# Patient Record
Sex: Male | Born: 1940 | Race: White | Hispanic: No | Marital: Married | Smoking: Former smoker
Health system: Southern US, Community
[De-identification: ages and names within clinical notes are randomized; demographics above are authoritative.]

## PROBLEM LIST (undated history)

## (undated) DIAGNOSIS — J449 Chronic obstructive pulmonary disease, unspecified: Secondary | ICD-10-CM

## (undated) DIAGNOSIS — N390 Urinary tract infection, site not specified: Secondary | ICD-10-CM

## (undated) DIAGNOSIS — F32A Depression, unspecified: Secondary | ICD-10-CM

## (undated) DIAGNOSIS — M47817 Spondylosis without myelopathy or radiculopathy, lumbosacral region: Secondary | ICD-10-CM

## (undated) DIAGNOSIS — N400 Enlarged prostate without lower urinary tract symptoms: Secondary | ICD-10-CM

## (undated) DIAGNOSIS — R319 Hematuria, unspecified: Secondary | ICD-10-CM

## (undated) DIAGNOSIS — M719 Bursopathy, unspecified: Secondary | ICD-10-CM

## (undated) DIAGNOSIS — M6580 Other synovitis and tenosynovitis, unspecified site: Secondary | ICD-10-CM

## (undated) DIAGNOSIS — G4733 Obstructive sleep apnea (adult) (pediatric): Secondary | ICD-10-CM

## (undated) DIAGNOSIS — M25519 Pain in unspecified shoulder: Secondary | ICD-10-CM

## (undated) DIAGNOSIS — F329 Major depressive disorder, single episode, unspecified: Secondary | ICD-10-CM

## (undated) DIAGNOSIS — N200 Calculus of kidney: Secondary | ICD-10-CM

## (undated) DIAGNOSIS — F319 Bipolar disorder, unspecified: Secondary | ICD-10-CM

## (undated) DIAGNOSIS — E11329 Type 2 diabetes mellitus with mild nonproliferative diabetic retinopathy without macular edema: Secondary | ICD-10-CM

## (undated) DIAGNOSIS — M19019 Primary osteoarthritis, unspecified shoulder: Secondary | ICD-10-CM

## (undated) DIAGNOSIS — D126 Benign neoplasm of colon, unspecified: Secondary | ICD-10-CM

## (undated) DIAGNOSIS — R0602 Shortness of breath: Secondary | ICD-10-CM

## (undated) DIAGNOSIS — M7989 Other specified soft tissue disorders: Secondary | ICD-10-CM

## (undated) DIAGNOSIS — K219 Gastro-esophageal reflux disease without esophagitis: Secondary | ICD-10-CM

## (undated) DIAGNOSIS — M67919 Unspecified disorder of synovium and tendon, unspecified shoulder: Secondary | ICD-10-CM

## (undated) DIAGNOSIS — R222 Localized swelling, mass and lump, trunk: Secondary | ICD-10-CM

## (undated) DIAGNOSIS — N35919 Unspecified urethral stricture, male, unspecified site: Secondary | ICD-10-CM

## (undated) DIAGNOSIS — N3941 Urge incontinence: Secondary | ICD-10-CM

## (undated) DIAGNOSIS — R42 Dizziness and giddiness: Secondary | ICD-10-CM

## (undated) DIAGNOSIS — M25659 Stiffness of unspecified hip, not elsewhere classified: Secondary | ICD-10-CM

## (undated) DIAGNOSIS — E119 Type 2 diabetes mellitus without complications: Secondary | ICD-10-CM

## (undated) DIAGNOSIS — K7689 Other specified diseases of liver: Secondary | ICD-10-CM

## (undated) DIAGNOSIS — Z9109 Other allergy status, other than to drugs and biological substances: Secondary | ICD-10-CM

## (undated) DIAGNOSIS — Z79899 Other long term (current) drug therapy: Secondary | ICD-10-CM

## (undated) DIAGNOSIS — R351 Nocturia: Secondary | ICD-10-CM

## (undated) DIAGNOSIS — R350 Frequency of micturition: Secondary | ICD-10-CM

## (undated) DIAGNOSIS — E785 Hyperlipidemia, unspecified: Secondary | ICD-10-CM

## (undated) DIAGNOSIS — F528 Other sexual dysfunction not due to a substance or known physiological condition: Secondary | ICD-10-CM

## (undated) DIAGNOSIS — M1710 Unilateral primary osteoarthritis, unspecified knee: Secondary | ICD-10-CM

## (undated) DIAGNOSIS — M25579 Pain in unspecified ankle and joints of unspecified foot: Secondary | ICD-10-CM

## (undated) DIAGNOSIS — E669 Obesity, unspecified: Secondary | ICD-10-CM

## (undated) DIAGNOSIS — IMO0002 Reserved for concepts with insufficient information to code with codable children: Secondary | ICD-10-CM

## (undated) HISTORY — DX: Other synovitis and tenosynovitis, unspecified site: M65.80

## (undated) HISTORY — DX: Calculus of kidney: N20.0

## (undated) HISTORY — DX: Unilateral primary osteoarthritis, unspecified knee: M17.10

## (undated) HISTORY — DX: Hematuria, unspecified: R31.9

## (undated) HISTORY — DX: Other specified soft tissue disorders: M79.89

## (undated) HISTORY — DX: Urinary tract infection, site not specified: N39.0

## (undated) HISTORY — DX: Dizziness and giddiness: R42

## (undated) HISTORY — DX: Stiffness of unspecified hip, not elsewhere classified: M25.659

## (undated) HISTORY — PX: APPENDECTOMY: SHX54

## (undated) HISTORY — DX: Pain in unspecified shoulder: M25.519

## (undated) HISTORY — DX: Bursopathy, unspecified: M71.9

## (undated) HISTORY — PX: PROSTATE SURGERY: SHX751

## (undated) HISTORY — PX: SHOULDER SURGERY: SHX246

## (undated) HISTORY — PX: KNEE SURGERY: SHX244

## (undated) HISTORY — DX: Gastro-esophageal reflux disease without esophagitis: K21.9

## (undated) HISTORY — DX: Obesity, unspecified: E66.9

## (undated) HISTORY — DX: Nocturia: R35.1

## (undated) HISTORY — DX: Reserved for concepts with insufficient information to code with codable children: IMO0002

## (undated) HISTORY — DX: Urge incontinence: N39.41

## (undated) HISTORY — DX: Shortness of breath: R06.02

## (undated) HISTORY — DX: Frequency of micturition: R35.0

## (undated) HISTORY — DX: Chronic obstructive pulmonary disease, unspecified: J44.9

## (undated) HISTORY — DX: Other allergy status, other than to drugs and biological substances: Z91.09

## (undated) HISTORY — DX: Other sexual dysfunction not due to a substance or known physiological condition: F52.8

## (undated) HISTORY — PX: TONSILLECTOMY: SUR1361

## (undated) HISTORY — DX: Unspecified urethral stricture, male, unspecified site: N35.919

## (undated) HISTORY — PX: INGUINAL HERNIA REPAIR: SUR1180

## (undated) HISTORY — DX: Type 2 diabetes mellitus with mild nonproliferative diabetic retinopathy without macular edema: E11.329

## (undated) HISTORY — DX: Pain in unspecified ankle and joints of unspecified foot: M25.579

## (undated) HISTORY — DX: Primary osteoarthritis, unspecified shoulder: M19.019

## (undated) HISTORY — DX: Obstructive sleep apnea (adult) (pediatric): G47.33

## (undated) HISTORY — DX: Spondylosis without myelopathy or radiculopathy, lumbosacral region: M47.817

## (undated) HISTORY — DX: Unspecified disorder of synovium and tendon, unspecified shoulder: M67.919

## (undated) HISTORY — DX: Benign neoplasm of colon, unspecified: D12.6

## (undated) HISTORY — DX: Localized swelling, mass and lump, trunk: R22.2

## (undated) HISTORY — DX: Benign prostatic hyperplasia without lower urinary tract symptoms: N40.0

## (undated) HISTORY — DX: Type 2 diabetes mellitus without complications: E11.9

## (undated) HISTORY — DX: Other specified diseases of liver: K76.89

## (undated) HISTORY — DX: Other long term (current) drug therapy: Z79.899

## (undated) HISTORY — DX: Hyperlipidemia, unspecified: E78.5

## (undated) HISTORY — PX: UMBILICAL HERNIA REPAIR: SHX196

---

## 2005-11-30 HISTORY — PX: CHOLECYSTECTOMY: SHX55

## 2006-08-31 HISTORY — PX: TRANSURETHRAL RESECTION OF PROSTATE: SHX73

## 2007-11-27 HISTORY — PX: ESOPHAGOGASTRODUODENOSCOPY: SHX1529

## 2009-10-21 ENCOUNTER — Ambulatory Visit: Payer: Self-pay | Admitting: Diagnostic Radiology

## 2009-10-21 ENCOUNTER — Emergency Department (HOSPITAL_BASED_OUTPATIENT_CLINIC_OR_DEPARTMENT_OTHER): Admission: EM | Admit: 2009-10-21 | Discharge: 2009-10-21 | Payer: Self-pay | Admitting: Emergency Medicine

## 2010-05-15 LAB — CBC
HCT: 40.7 % (ref 39.0–52.0)
RBC: 4.23 MIL/uL (ref 4.22–5.81)
RDW: 13.8 % (ref 11.5–15.5)
WBC: 9.8 10*3/uL (ref 4.0–10.5)

## 2010-05-15 LAB — DIFFERENTIAL
Monocytes Absolute: 0.7 10*3/uL (ref 0.1–1.0)
Monocytes Relative: 7 % (ref 3–12)
Neutro Abs: 7.8 10*3/uL — ABNORMAL HIGH (ref 1.7–7.7)
Neutrophils Relative %: 79 % — ABNORMAL HIGH (ref 43–77)

## 2010-05-15 LAB — BASIC METABOLIC PANEL
Calcium: 9.6 mg/dL (ref 8.4–10.5)
GFR calc non Af Amer: 43 mL/min — ABNORMAL LOW (ref >60)
Glucose, Bld: 356 mg/dL — ABNORMAL HIGH (ref 70–99)
Potassium: 4.5 mEq/L (ref 3.5–5.1)

## 2010-05-15 LAB — POCT I-STAT 3, ART BLOOD GAS (G3+)
pH, Arterial: 7.441 (ref 7.350–7.450)
pO2, Arterial: 81 mmHg (ref 80.0–100.0)

## 2010-05-15 LAB — POCT CARDIAC MARKERS
CKMB, poc: 1.1 ng/mL (ref 1.0–8.0)
Myoglobin, poc: 118 ng/mL (ref 12–200)

## 2010-05-15 LAB — POCT B-TYPE NATRIURETIC PEPTIDE (BNP): B Natriuretic Peptide, POC: 32.5 pg/mL (ref 0–100)

## 2010-11-12 ENCOUNTER — Encounter: Payer: Self-pay | Admitting: Thoracic Surgery (Cardiothoracic Vascular Surgery)

## 2010-11-16 ENCOUNTER — Ambulatory Visit (INDEPENDENT_AMBULATORY_CARE_PROVIDER_SITE_OTHER): Payer: Self-pay | Admitting: Thoracic Surgery (Cardiothoracic Vascular Surgery)

## 2010-11-16 DIAGNOSIS — R222 Localized swelling, mass and lump, trunk: Secondary | ICD-10-CM

## 2010-11-16 DIAGNOSIS — J9859 Other diseases of mediastinum, not elsewhere classified: Secondary | ICD-10-CM

## 2010-11-16 NOTE — Progress Notes (Signed)
Mr Brendan Thomas is a 70 yo man with multiple medical problems including COPD, sleep apnea, Type 2 insulin-dependent Dm,hypercholesterolemia, arthritis and renal insufficiency. He recently had gross hematuria. A CT of the abdomen was done and a mediastinal mass noted. A chest CT then was done showing a well circumscribed 2.5 cm mass in the right anterior mediastinum. A PET was done and showed an SUV of 2.58. There were several small foci of radiotracer uptake in the T and L spine. There were no parenchymal lung lesions or hilar or mediastinal adenopathy. He denies visual changes. Pulmonary status is stable. Has chronic back pain, but nothing has changed recently.  PMH: COPD, sleep apnea, type 2 IDDM, arthritis, hypercholesterolemia, hx benign adenoma of colon, renal insufficiency, urethral stricture, TURP, hernia repair, appendectomy, cholecystectomy, GERD, UTI, Nephrolithiasis, fatty liver, rotator cuff surgery, peripheral edema  PSH; see above  FHx: noncontributory  Social: 60 pack year history of smoking quit 12 years ago., lives with wife, retired, Systems analyst 3 x/ week  ROS: SOB, cough(nonproductive), wheezing(intermittent), No CP, tightness, pressure, no diplopia or blurred vision, chronic back and joint pain, hematuria, mild dysphagia, all other -  PE: Obese 70 yo WM in NAD Neuro: intact HEENT: [place]/AT, glasses Neck: No bruit, thyromegaly or adenopathy Chest: Lungs with decreased BS bilat, no wheeze or stridor Cardiac: RRR, nl s1, nls2, no murmur ABD: obese Ext: no clubbing,cyanosis or edema  Impression: 70 yo WM with incidentally discovered right anterior mediastinal mass. Reviewed CT and PET with patient and wife. D/w them the differential diagnosis, benign or malignant thymoma, lymphoma, teratoma or ectopic thyroid. D/w them the options surgical removal vs. Observation. I recommended that we proceed with surgical removal. Options surgically, right ant. Mediastinotomy, sternotomy, right  VATS. I think best option is right ant. Mediastinotomy. I d/w them the indications, risks, and benefits. They understand risks include but are not limited to death, MI, bleeding, need for transfusion, infection, respiratory complications, such as pneumonia, renal dysfunction.  He accepts risks and agrees to proceed.   Plan: Right anterior mediastinotomy, resection of mediastinal mass, possible sternotomy. Plan OR thurs 9/20.  Plan:

## 2010-12-02 ENCOUNTER — Ambulatory Visit: Payer: Medicare Other

## 2010-12-07 DIAGNOSIS — R222 Localized swelling, mass and lump, trunk: Secondary | ICD-10-CM

## 2010-12-23 ENCOUNTER — Ambulatory Visit (INDEPENDENT_AMBULATORY_CARE_PROVIDER_SITE_OTHER): Payer: Medicare Other | Admitting: Physician Assistant

## 2010-12-23 DIAGNOSIS — R222 Localized swelling, mass and lump, trunk: Secondary | ICD-10-CM

## 2010-12-23 DIAGNOSIS — Z09 Encounter for follow-up examination after completed treatment for conditions other than malignant neoplasm: Secondary | ICD-10-CM

## 2010-12-23 NOTE — Progress Notes (Signed)
Mr. Brendan Thomas returned today for scheduled postoperative follow up after right anterior mediastinotomy for resection of a 5x4.5x2.5cm mass.  The [facility] by our local pathologist(Dr. Larina Earthly) for evaluation.  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  It was well circumscribed with no evidence of invasion or malignancy.  Four of four lymph nodes showed no evidence of malignancy. Mr. Brendan Thomas had an uncomplicated post-operative course except for urinary retention which ws evaluated by Dr. Cleatrice Burke in the hospital.  Mr. Brendan Thomas was discharged with a foley catheter in place but he has since followed up with the urology team and had it removed.  The patient denies any other problems since his discharge.  He has minimal pain associated with the incision. On exam, his heart is in a RRR.  The chest incision is intact and well healed.  Breath sounds are CTA. There is no palpable lymphadenopathy.  The chest tube site suture is removed. The findings of the pathologists are discussed with Dr. Laneta Simmers and then relayed to the patient and his wife. No other treatment appears necessary for this lesion.  He has scheduled follow up with his primary care MD as well as Dr. Cleatrice Burke regarding the hematuria which initially lead to discovering the thymoma.

## 2013-01-19 ENCOUNTER — Emergency Department (HOSPITAL_BASED_OUTPATIENT_CLINIC_OR_DEPARTMENT_OTHER)
Admission: EM | Admit: 2013-01-19 | Discharge: 2013-01-19 | Disposition: A | Discharge status: Other Acute Inpatient Hospital | Payer: Medicare Other | Attending: Emergency Medicine | Admitting: Emergency Medicine

## 2013-01-19 ENCOUNTER — Emergency Department (HOSPITAL_BASED_OUTPATIENT_CLINIC_OR_DEPARTMENT_OTHER): Payer: Medicare Other

## 2013-01-19 ENCOUNTER — Encounter (HOSPITAL_BASED_OUTPATIENT_CLINIC_OR_DEPARTMENT_OTHER): Payer: Self-pay | Admitting: Emergency Medicine

## 2013-01-19 DIAGNOSIS — Z794 Long term (current) use of insulin: Secondary | ICD-10-CM | POA: Insufficient documentation

## 2013-01-19 DIAGNOSIS — IMO0002 Reserved for concepts with insufficient information to code with codable children: Secondary | ICD-10-CM | POA: Insufficient documentation

## 2013-01-19 DIAGNOSIS — E11329 Type 2 diabetes mellitus with mild nonproliferative diabetic retinopathy without macular edema: Secondary | ICD-10-CM | POA: Insufficient documentation

## 2013-01-19 DIAGNOSIS — E785 Hyperlipidemia, unspecified: Secondary | ICD-10-CM | POA: Insufficient documentation

## 2013-01-19 DIAGNOSIS — F319 Bipolar disorder, unspecified: Secondary | ICD-10-CM | POA: Insufficient documentation

## 2013-01-19 DIAGNOSIS — Z87442 Personal history of urinary calculi: Secondary | ICD-10-CM | POA: Insufficient documentation

## 2013-01-19 DIAGNOSIS — Z87448 Personal history of other diseases of urinary system: Secondary | ICD-10-CM | POA: Insufficient documentation

## 2013-01-19 DIAGNOSIS — Y929 Unspecified place or not applicable: Secondary | ICD-10-CM | POA: Insufficient documentation

## 2013-01-19 DIAGNOSIS — Y939 Activity, unspecified: Secondary | ICD-10-CM | POA: Insufficient documentation

## 2013-01-19 DIAGNOSIS — E1139 Type 2 diabetes mellitus with other diabetic ophthalmic complication: Secondary | ICD-10-CM | POA: Insufficient documentation

## 2013-01-19 DIAGNOSIS — M1710 Unilateral primary osteoarthritis, unspecified knee: Secondary | ICD-10-CM | POA: Insufficient documentation

## 2013-01-19 DIAGNOSIS — Z79899 Other long term (current) drug therapy: Secondary | ICD-10-CM | POA: Insufficient documentation

## 2013-01-19 DIAGNOSIS — Z87891 Personal history of nicotine dependence: Secondary | ICD-10-CM | POA: Insufficient documentation

## 2013-01-19 DIAGNOSIS — R296 Repeated falls: Secondary | ICD-10-CM | POA: Insufficient documentation

## 2013-01-19 DIAGNOSIS — E669 Obesity, unspecified: Secondary | ICD-10-CM | POA: Insufficient documentation

## 2013-01-19 DIAGNOSIS — R292 Abnormal reflex: Secondary | ICD-10-CM | POA: Insufficient documentation

## 2013-01-19 DIAGNOSIS — M47817 Spondylosis without myelopathy or radiculopathy, lumbosacral region: Secondary | ICD-10-CM | POA: Insufficient documentation

## 2013-01-19 DIAGNOSIS — J449 Chronic obstructive pulmonary disease, unspecified: Secondary | ICD-10-CM | POA: Insufficient documentation

## 2013-01-19 DIAGNOSIS — J4489 Other specified chronic obstructive pulmonary disease: Secondary | ICD-10-CM | POA: Insufficient documentation

## 2013-01-19 DIAGNOSIS — S46909A Unspecified injury of unspecified muscle, fascia and tendon at shoulder and upper arm level, unspecified arm, initial encounter: Secondary | ICD-10-CM | POA: Insufficient documentation

## 2013-01-19 DIAGNOSIS — S4980XA Other specified injuries of shoulder and upper arm, unspecified arm, initial encounter: Secondary | ICD-10-CM | POA: Insufficient documentation

## 2013-01-19 DIAGNOSIS — R4701 Aphasia: Secondary | ICD-10-CM | POA: Insufficient documentation

## 2013-01-19 DIAGNOSIS — Z87440 Personal history of urinary (tract) infections: Secondary | ICD-10-CM | POA: Insufficient documentation

## 2013-01-19 HISTORY — DX: Bipolar disorder, unspecified: F31.9

## 2013-01-19 HISTORY — DX: Major depressive disorder, single episode, unspecified: F32.9

## 2013-01-19 HISTORY — DX: Depression, unspecified: F32.A

## 2013-01-19 LAB — CBC WITH DIFFERENTIAL/PLATELET
Band Neutrophils: 2 % (ref 0–10)
Blasts: 0 %
HCT: 41.4 % (ref 39.0–52.0)
Hemoglobin: 13.6 g/dL (ref 13.0–17.0)
MCH: 31.4 pg (ref 26.0–34.0)
MCHC: 32.9 g/dL (ref 30.0–36.0)
MCV: 95.6 fL (ref 78.0–100.0)
Monocytes Absolute: 0.3 10*3/uL (ref 0.1–1.0)
Monocytes Relative: 5 % (ref 3–12)
Myelocytes: 0 %
Neutrophils Relative %: 59 % (ref 43–77)
Platelets: 129 10*3/uL — ABNORMAL LOW (ref 150–400)
Promyelocytes Absolute: 0 %
RDW: 13.7 % (ref 11.5–15.5)
nRBC: 0 /100 WBC

## 2013-01-19 LAB — COMPREHENSIVE METABOLIC PANEL
Albumin: 3.1 g/dL — ABNORMAL LOW (ref 3.5–5.2)
Alkaline Phosphatase: 48 U/L (ref 39–117)
BUN: 25 mg/dL — ABNORMAL HIGH (ref 6–23)
CO2: 25 mEq/L (ref 19–32)
Calcium: 8.9 mg/dL (ref 8.4–10.5)
Chloride: 105 mEq/L (ref 96–112)
Creatinine, Ser: 1.5 mg/dL — ABNORMAL HIGH (ref 0.50–1.35)
GFR calc Af Amer: 51 mL/min — ABNORMAL LOW (ref >90)
GFR calc non Af Amer: 44 mL/min — ABNORMAL LOW (ref >90)
Glucose, Bld: 202 mg/dL — ABNORMAL HIGH (ref 70–99)
Potassium: 4.3 mEq/L (ref 3.5–5.1)
Total Bilirubin: 0.2 mg/dL — ABNORMAL LOW (ref 0.3–1.2)

## 2013-01-19 LAB — AMMONIA: Ammonia: 20 umol/L (ref 11–60)

## 2013-01-19 LAB — URINALYSIS, ROUTINE W REFLEX MICROSCOPIC
Glucose, UA: >1000 mg/dL — AB
Hgb urine dipstick: NEGATIVE
Ketones, ur: NEGATIVE mg/dL
Leukocytes, UA: NEGATIVE
Nitrite: NEGATIVE
Protein, ur: NEGATIVE mg/dL
Urobilinogen, UA: 0.2 mg/dL (ref 0.0–1.0)

## 2013-01-19 LAB — RAPID URINE DRUG SCREEN, HOSP PERFORMED
Barbiturates: NOT DETECTED
Cocaine: NOT DETECTED
Tetrahydrocannabinol: NOT DETECTED

## 2013-01-19 LAB — GLUCOSE, CAPILLARY

## 2013-01-19 LAB — URINE MICROSCOPIC-ADD ON

## 2013-01-19 LAB — VALPROIC ACID LEVEL: Valproic Acid Lvl: 56.9 ug/mL (ref 50.0–100.0)

## 2013-01-19 NOTE — ED Notes (Signed)
Urinal provided.

## 2013-01-19 NOTE — ED Notes (Signed)
Pt reports confusion that started 1 week ago and developed dizziness and slurred speech yesterday.  States he fell yesterday 2 times and has right shoulder and left heel pain.

## 2013-01-19 NOTE — ED Provider Notes (Signed)
CSN: [id number]     Arrival date & time 01/19/13  1113 History   First MD Initiated Contact with Patient 01/19/13 1207     Chief Complaint  Patient presents with  . Dizziness  . Aphasia  . Fall  . Altered Mental Status   (Consider location/radiation/quality/duration/timing/severity/associated sxs/prior Treatment) HPI  Brendan Thomas Is a 55 old male with a history of diabetes, hyperlipidemia, obesity, previous benign mediastinal mass,, nephropathy and history of bipolar disorder who presents the emergency department with progressively worsening mental status decline.  History is given by the patient, his wife and daughter who are present).  Approximately 3 months ago the patient was reached to Seroquel and Depakote.  Since that time he has had worsening confusion, ataxia.  Recently he has had slurred speech.  This morning his speech was acutely worsened and slurred which lasted approximately 2 hours and prompted their visit to the emergency department.  The patient has recently had multiple falls including one this morning.  Patient now complains of left heel pain and small skin tear to the back of the left heel.  He also has right shoulder pain.  Patient did not hit his head or lose consciousness.  The patient has also had 2-3 episodes of difficulty understanding written speech.  Patient denies changes in vision, unilateral weakness, difficulty with swallowing.  Along with the above mentioned risk factor for stroke the patient also has a 30-pack-year smoking history but has not smoked for the past 18 years. The patient has had difficulty controlling his blood sugars of late.  He has had progressively worsening bilateral neuropathy.  Wife and daughter state they have noticed some increased dyspnea.   Past Medical History  Diagnosis Date  . Swelling of limb     (Lower) Leg Localized Swelling Bilateral  . Other allergy, other than to medicinal agents   . Pain in joint, ankle and foot   .  Benign neoplasm of colon     of large intestine  . Hypertrophy of prostate without urinary obstruction and other lower urinary tract symptoms (LUTS)   . COPD (chronic obstructive pulmonary disease)   . Esophageal reflux   . Other chronic nonalcoholic liver disease   . Hematuria, unspecified   . Hyperlipidemia   . Pain in joint, ankle and foot   . Pain in joint, shoulder region     Localized In the Right Shoulder  . Stiffness of joint, not elsewhere classified, pelvic region and thigh     Of the Hip  . Primary localized osteoarthrosis, lower leg     Of the Left Knee  . Primary localized osteoarthrosis, shoulder region     Of the Right Shoulder  . Thoracic or lumbosacral neuritis or radiculitis, unspecified   . Lumbosacral spondylosis without myelopathy   . Psychosexual dysfunction with inhibited sexual excitement   . Swelling, mass, or lump in chest   . Calculus of kidney     of Both Kidneys  . 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 Obesity, unspecified   . Obstructive sleep apnea (adult) (pediatric)   . Disorders of bursae and tendons in shoulder region, unspecified   . Shortness of breath   . Other synovitis and tenosynovitis     Of the Left Knee  . Encounter for long-term (current) use of other medications   . Type II or unspecified type diabetes mellitus without mention of complication, not stated as uncontrolled   . Urethral stricture unspecified   . Urge incontinence   .  Urinary frequency   . Nocturia   . Urinary tract infection, site not specified     Geriatric Presentation  . Dizziness and giddiness   . Depression   . Bipolar 1 disorder    Past Surgical History  Procedure Laterality Date  . Prostate surgery    . Shoulder surgery    . Knee surgery    . Appendectomy    . Cholecystectomy  11-30-2005    Laparoscopic  . Esophagogastroduodenoscopy  11-27-2007  . Inguinal hernia repair    . Tonsillectomy    . Transurethral resection of prostate   08-31-2006    [facility] po days 90-Diagnosis Hypertrophy prostate with urinary obstruction  . Umbilical hernia repair     No family history on file. History  Substance Use Topics  . Smoking status: Former Games developer  . Smokeless tobacco: Not on file  . Alcohol Use: No    Review of Systems  Unable to perform ROS: Mental status change    Allergies  Anaphylaxis bee sting; Latuda; and Lamictal  Home Medications   Current Outpatient Rx  Name  Route  Sig  Dispense  Refill  . divalproex (DEPAKOTE ER) 500 MG 24 hr tablet   Oral   Take 500 mg by mouth daily. New medication- started 2 weeks ago 500mg  every other night and 1500mg  every other night         . QUEtiapine (SEROQUEL XR) 300 MG 24 hr tablet   Oral   Take 300 mg by mouth at bedtime. New medication- started 01/11/2013         . albuterol (PROAIR HFA) 108 (90 BASE) MCG/ACT inhaler      Inhale 2 Puffs Every 4-6 Hours PRN          . BD ULTRA-FINE LANCETS lancets   Other   by Other route. Use as instructed          . Blood Glucose Calibration (ACCU-CHEK AVIVA) SOLN   In Vitro   by In Vitro route.           Marland Kitchen EPINEPHrine (EPIPEN 2-PAK) 0.3 mg/0.3 mL DEVI   Intramuscular   Inject 0.3 mg into the muscle as directed.           . fenofibrate 160 MG tablet   Oral   Take 160 mg by mouth daily.           Marland Kitchen glucose blood (ACCU-CHEK AVIVA) test strip      Accu-Chek Aviva In Vitro Strip; Use one (1) strip to test twice a day          . insulin aspart (NOVOLOG) 100 UNIT/ML injection      Sliding Scale  3 Times A Day         . insulin glargine (LANTUS) 100 UNIT/ML injection      Inject 60 Units once daily         . ketoconazole (NIZORAL) 2 % shampoo      Wash Scalp 2-3 times per week          . LORazepam (ATIVAN) 1 MG tablet      Take 1 tablet daily          . OMEPRAZOLE PO      20 mg Oral Capsule Delayed Release; Take 1 Capsule Every Day          . oxiconazole (OXISTAT) 1 % lotion       Apply At Bedtime To Face & Ears             .  simvastatin (ZOCOR) 40 MG tablet   Oral   Take 40 mg by mouth daily.            BP 142/73  Pulse 88  Temp(Src) 97.4 F (36.3 C) (Oral)  Resp 20  Ht 6' (1.829 m)  Wt 280 lb (127.007 kg)  BMI 37.97 kg/m2  SpO2 99% Physical Exam  Nursing note and vitals reviewed. Constitutional: He appears well-developed and well-nourished. No distress.  HENT:  Head: Normocephalic and atraumatic.  Eyes: Conjunctivae are normal. Pupils are equal, round, and reactive to light. No scleral icterus.  Miotic pupils, sluggish. Patient does not focus well.  Neck: Normal range of motion. Neck supple. No thyromegaly present.  Cardiovascular: Normal rate, regular rhythm and normal heart sounds.   Pulmonary/Chest: Effort normal and breath sounds normal. No respiratory distress. He has no wheezes. He exhibits no tenderness.  Abdominal: Soft. He exhibits no distension and no mass. There is no tenderness. There is no guarding.  Musculoskeletal: He exhibits no edema.  Right shoulder exam reveals difficulty with abduction and internal rotation of the shoulder.  He is tender to palpation over the short head of the biceps.  Some weakness secondary to pain.  Distal pulses and sensation intact.  Lymphadenopathy:    He has no cervical adenopathy.  Neurological: He is alert. He displays abnormal reflex (hyporefflexia).  Patient oriented to his name and date of birth. Unable to correctly answer location, and day of the week. The patient is able to read a set of simple written words. Speech is dysarthric and goal oriented follows commands Major Cranial nerves without deficit, no facial droop Normal strength in upper and lower extremities bilaterally including dorsiflexion and plantar flexion, strong and equal grip strength Mild dysmetria to F-N Sensation normal to light and sharp touch Moves extremities without ataxia, coordination intact Normal finger to nose and  rapid alternating movements Gait deffered    Skin: Skin is warm and dry. No rash noted. He is not diaphoretic. No erythema.  1cm skin avulsion L posterior heel  Psychiatric: His behavior is normal.     ED Course  Procedures (including critical care time) Labs Review Labs Reviewed  CBC WITH DIFFERENTIAL - Abnormal; Notable for the following:    Platelets 129 (*)    All other components within normal limits  COMPREHENSIVE METABOLIC PANEL - Abnormal; Notable for the following:    Glucose, Bld 202 (*)    BUN 25 (*)    Creatinine, Ser 1.50 (*)    Albumin 3.1 (*)    Total Bilirubin 0.2 (*)    GFR calc non Af Amer 44 (*)    GFR calc Af Amer 51 (*)    All other components within normal limits  URINALYSIS, ROUTINE W REFLEX MICROSCOPIC  VALPROIC ACID LEVEL  TROPONIN I  URINE RAPID DRUG SCREEN (HOSP PERFORMED)  AMMONIA   Imaging Review No results found.  EKG Interpretation    Date/Time:  Friday January 19 2013 12:59:58 EST Ventricular Rate:  79 PR Interval:  154 QRS Duration: 86 QT Interval:  402 QTC Calculation: 460 R Axis:   49 Text Interpretation:  Normal sinus rhythm Normal ECG Confirmed by WARD  DO, KRISTEN ([id number]) on 01/19/2013 1:03:44 PM            Date: 01/19/2013  Rate: 79  Rhythm: normal sinus rhythm  QRS Axis: normal  Intervals: normal  ST/T Wave abnormalities: normal  Conduction Disutrbances:none  Narrative Interpretation:   Old EKG Reviewed: none available  MDM   1. Aphasia    Patient wil mutiple RF for stroke, however I have higher suspicion for chronic depakote toxicity. Also also on the differential is possible brain mass, infection and hyperammonemia secondary to Depakote toxicity.  Ordered labs, CT scan.  Patient's EKG is negative for acute ischemia.. I personally reviewed and interpreted EKGs. Patient seen in shared visit visit with Dr. Elesa Massed.   2:12 PM BP 142/73  Pulse 88  Temp(Src) 97.4 F (36.3 C) (Oral)  Resp 20  Ht 6' (1.829  m)  Wt 280 lb (127.007 kg)  BMI 37.97 kg/m2  SpO2 99% Patient with mildly elevated glucose today. Thrombocytopenia. Renal insufficiency. His depakote  Level is WNL, troponin  I negative. CT is without acute abnormality.  Patients ABCD2 score is 6 making him High risk for stroke after TIA, however due to his persistent symptoms, word finding difficulty, dysarthria, confusion and ataxia here I feel patient may have a had a stroke.  He is a fall risk and not safe for discharge home .    Patient accepted forinpatient admission by Dr. Mikeal Hawthorne at [facility]. Appears stable for transfer.   Arthor Captain, PA-C 01/20/13 1945  Arthor Captain, PA-C 01/20/13 1946

## 2013-01-19 NOTE — ED Notes (Signed)
Patient's wife reports pt has a history of depression x 17 years, was treated effectively by a psychiatrist and psychiatrist retired.  New MD diagnosed him with Bipolar, d/c'd medications and started him on Depakote and Seroquel.  Wife states he has had confusion, slurred speech, insomnia and unsteady gait since he has been on new medications.

## 2013-01-19 NOTE — ED Notes (Signed)
Attempted IV access x 3 unsuccessful. 

## 2013-01-19 NOTE — ED Provider Notes (Signed)
I saw and evaluated the patient, reviewed the resident's note and I agree with the findings and plan.  EKG Interpretation    Date/Time:  Friday January 19 2013 12:59:58 EST Ventricular Rate:  79 PR Interval:  154 QRS Duration: 86 QT Interval:  402 QTC Calculation: 460 R Axis:   49 Text Interpretation:  Normal sinus rhythm Normal ECG Confirmed by Laiah Pouncey  DO, Layney Gillson ([id number]) on 01/19/2013 1:03:44 PM            Patient is a 72 year old male with a history of depression, diabetes, hyperlipidemia who presents the emergency department with several days of ataxic gait, difficulty with word finding and slurred speech. Patient denies a prior history of stroke or TIA. No history of head injury or intracranially she. No numbness, tingling or focal weakness. No drug or alcohol use. Patient has recently been switched from Wellbutrin, Celexa and Abilify to taking Seroquel and Depakote. He states he is not sure if his symptoms are related to this change in medication. On exam, patient is well-appearing and hemodynamically stable. Heart and lung sounds are normal. Abdomen soft and nontender. Patient is having mild dysarthria and mild expressive aphasia. No weakness in his extremities or sensory deficit. He has an ataxic gait and mild dysmetria to finger to nose testing. Concern for possible medication effect versus stroke. Labs are unremarkable. Depakote level within normal limits. Head CT negative. I feel patient needs admission for CVA workup versus monitoring for possible medication side effect. I do not feel he is safe for discharge home given he is a fall risk and has fallen several times at home. Will discuss with hospitalist at [facility] for admission.  Layla Maw Jacy Brocker, DO 01/19/13 1510

## 2013-01-19 NOTE — ED Notes (Signed)
[facility] Hospitalist paged per PA-C request

## 2013-12-10 ENCOUNTER — Encounter: Payer: Self-pay | Admitting: Internal Medicine

## 2013-12-10 NOTE — Progress Notes (Signed)
This encounter was created in error - please disregard.

## 2017-06-30 ENCOUNTER — Other Ambulatory Visit: Payer: Self-pay | Admitting: Physical Medicine and Rehabilitation

## 2017-06-30 DIAGNOSIS — G8929 Other chronic pain: Secondary | ICD-10-CM

## 2017-06-30 DIAGNOSIS — M25551 Pain in right hip: Principal | ICD-10-CM

## 2017-07-03 ENCOUNTER — Ambulatory Visit
Admission: RE | Admit: 2017-07-03 | Discharge: 2017-07-03 | Disposition: A | Discharge status: Home / Self Care | Payer: Medicare Other | Source: Ambulatory Visit | Attending: Physical Medicine and Rehabilitation | Admitting: Physical Medicine and Rehabilitation

## 2017-07-03 DIAGNOSIS — M25551 Pain in right hip: Principal | ICD-10-CM

## 2017-07-03 DIAGNOSIS — G8929 Other chronic pain: Secondary | ICD-10-CM

## 2017-08-29 DEATH — deceased

## 2019-12-04 IMAGING — MR MR HIP*R* W/O CM
4 of 5 series · 28 of 40 positions shown · non-contrast
Comparison: None.

CLINICAL DATA: Right hip pain and right leg weakness. The patient
suffered a fall moving a bag of fertilizer 2 weeks ago.

EXAM:
MR OF THE RIGHT HIP WITHOUT CONTRAST
TECHNIQUE: Multiplanar, multisequence MR imaging was performed. No intravenous
contrast was administered.

[Series 9: STIR · coronal · right · 3.0mm · 1.25mm/px · 3 of 25 slices shown]
[im 5/25]
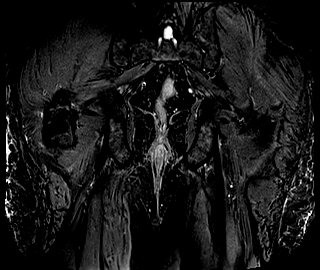
[im 15/25]
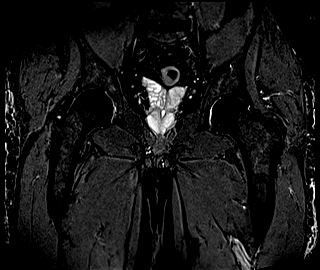
[im 25/25]
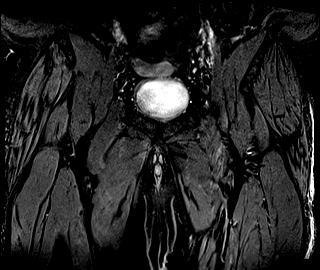

[Series 10: T1 · coronal · right · 3.0mm · 0.78mm/px · 7 of 25 slices shown (1 of 2)]
[im 1/25]
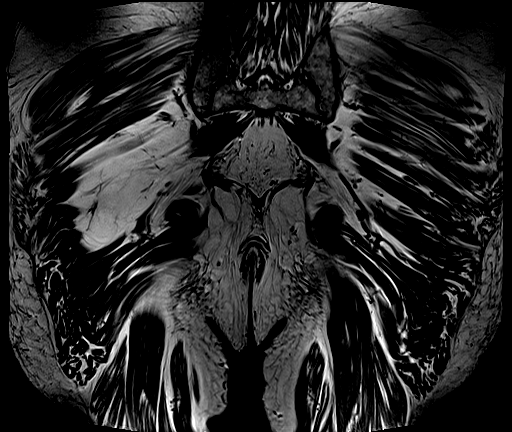
[im 5/25]
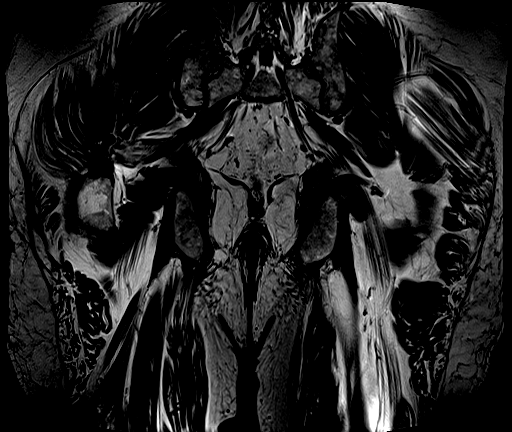
[im 9/25]
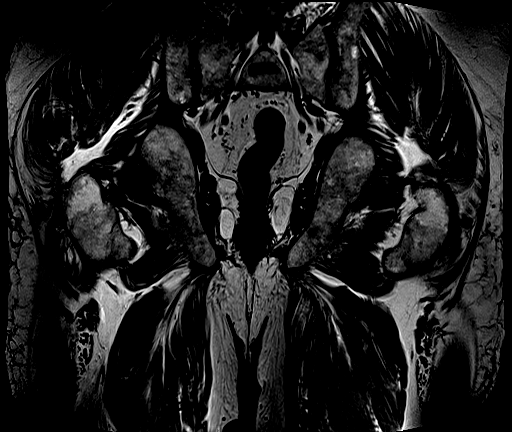
[im 13/25]
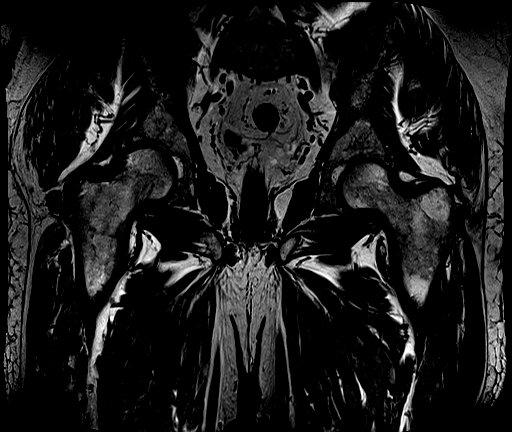
[im 17/25]
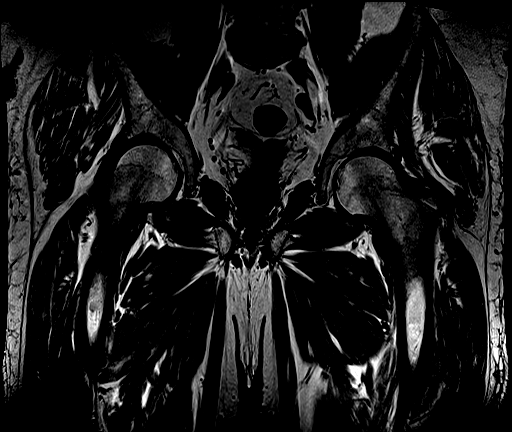
[im 21/25]
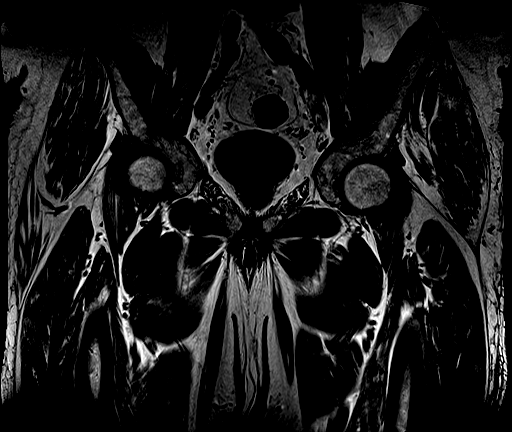
[im 25/25]
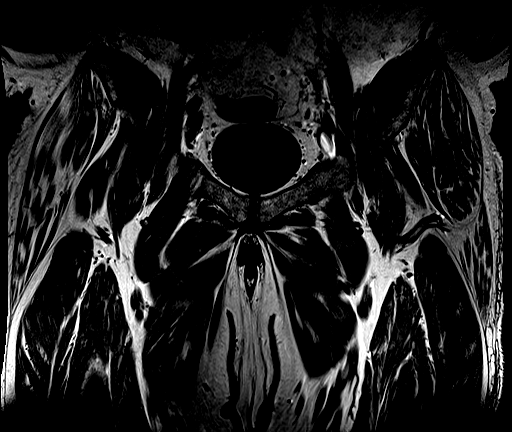

[Series 11: T1 · axial · right · 3.0mm · 0.78mm/px · z∈[-16,+96]mm · 9 of 32 slices shown (2 of 2)]
[im 1/32]
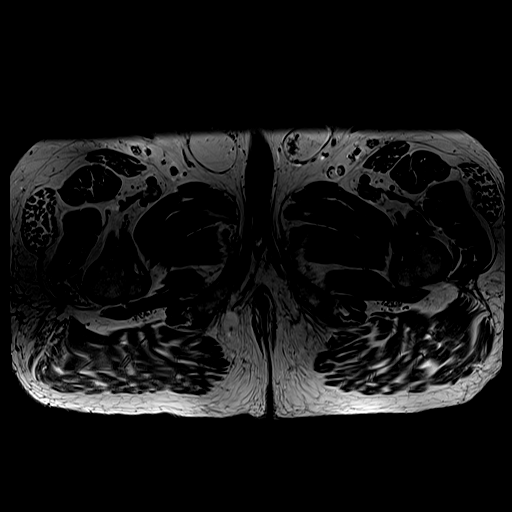
[im 4/32]
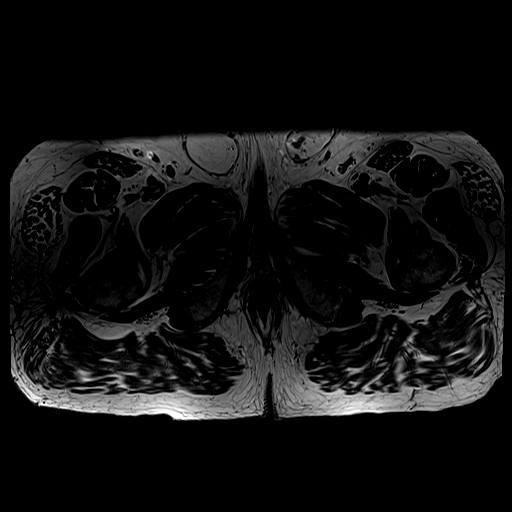
[im 8/32]
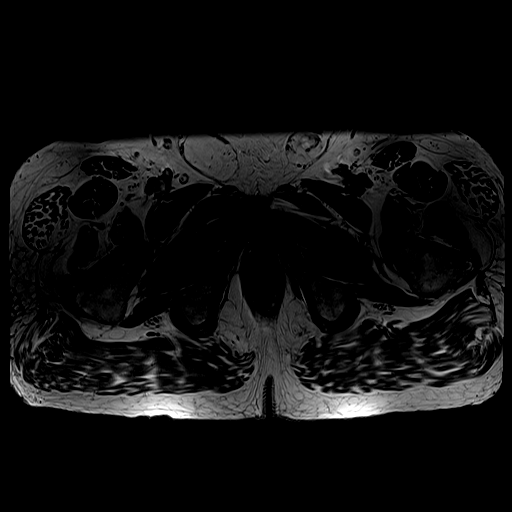
[im 12/32]
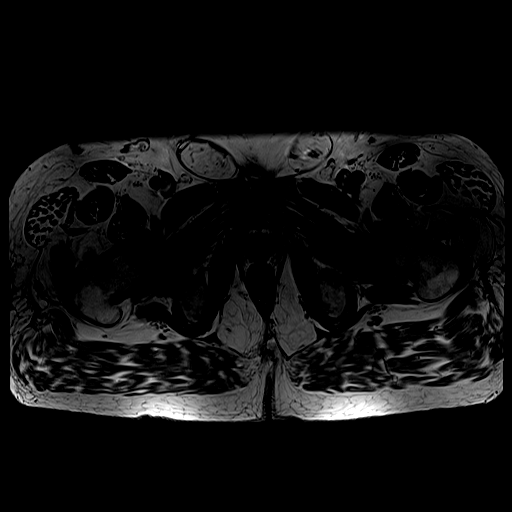
[im 16/32]
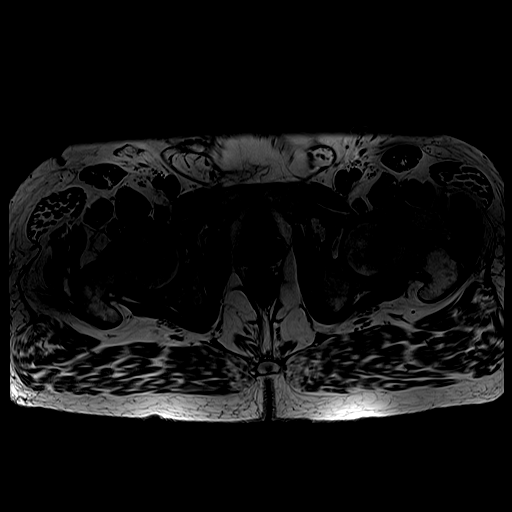
[im 20/32]
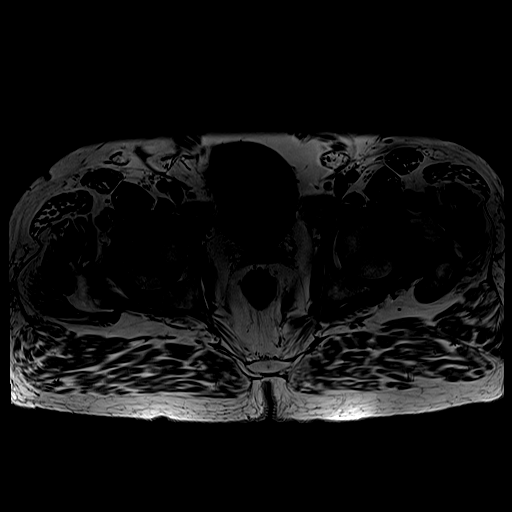
[im 24/32]
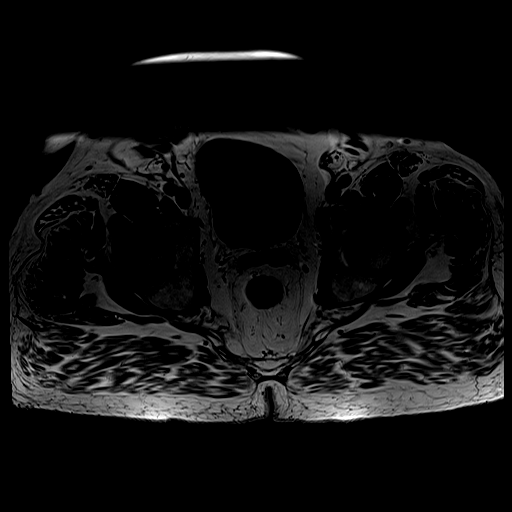
[im 28/32]
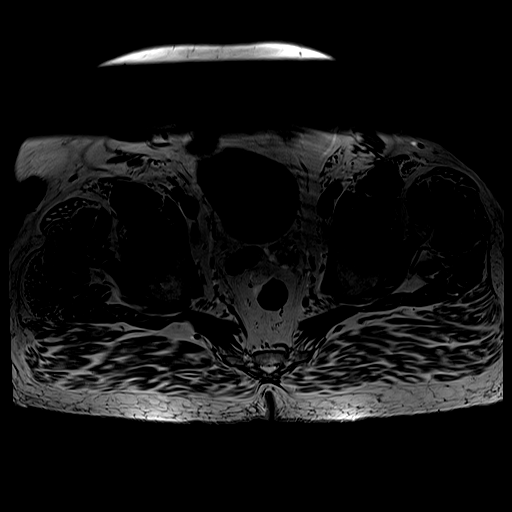
[im 32/32]
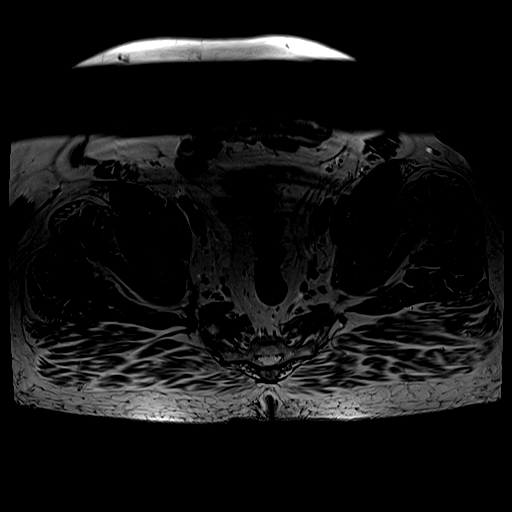

[Series 13: PD · sagittal · right · 3.0mm · 0.56mm/px · 9 of 35 slices shown]
[im 1/35]
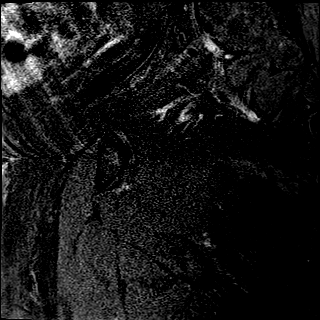
[im 5/35]
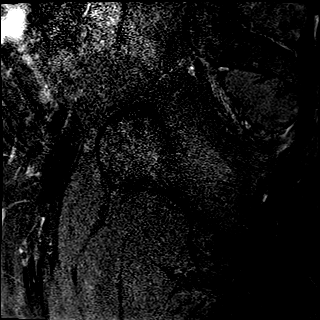
[im 9/35]
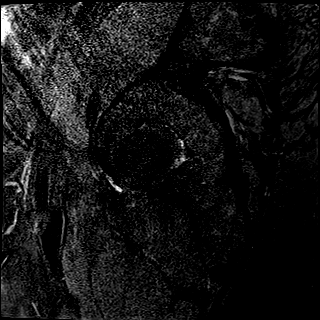
[im 13/35]
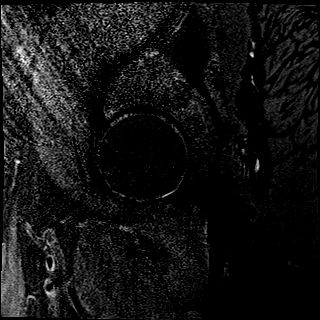
[im 18/35]
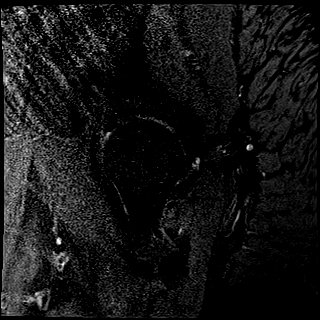
[im 22/35]
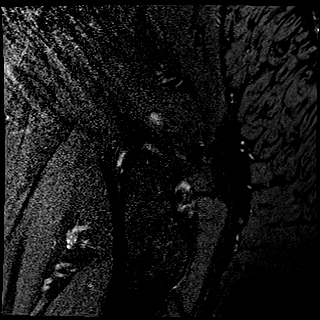
[im 26/35]
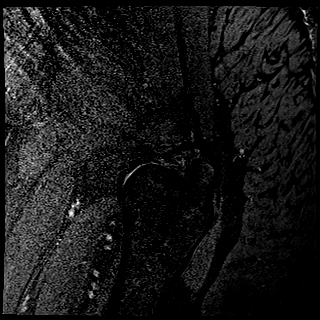
[im 30/35]
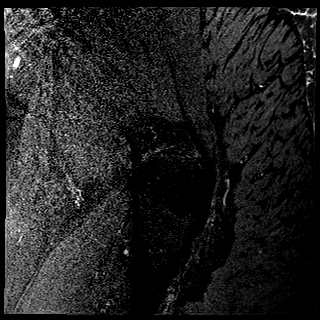
[im 35/35]
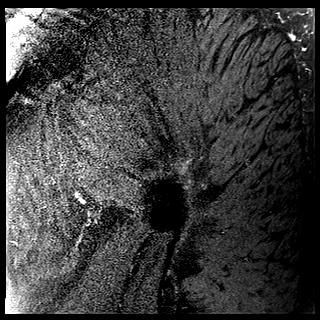

[28 of 40 positions shown; findings below may reference images not displayed]

FINDINGS: Bones: There is no acute bony or joint abnormality. Minimal
subchondral edema is present about both hips.

Articular cartilage and labrum

Articular cartilage:  Thinned without focal lesion.

Labrum:  Intact.

Joint or bursal effusion

Joint effusion:  None.

Bursae: Negative.

Muscles and tendons

Muscles and tendons:  Intact.

Other findings

Miscellaneous: The patient has fat containing bilateral inguinal
hernias.
IMPRESSION: Negative for acute abnormality.

Mild bilateral hip osteoarthritis.

Bilateral fat containing inguinal hernias.
